# Patient Record
Sex: Female | Born: 1940 | Race: White | Hispanic: No
Health system: Southern US, Community
[De-identification: ages and names within clinical notes are randomized; demographics above are authoritative.]

## PROBLEM LIST (undated history)

## (undated) DIAGNOSIS — F0390 Unspecified dementia without behavioral disturbance: Secondary | ICD-10-CM

## (undated) DIAGNOSIS — I10 Essential (primary) hypertension: Secondary | ICD-10-CM

## (undated) DIAGNOSIS — E079 Disorder of thyroid, unspecified: Secondary | ICD-10-CM

## (undated) DIAGNOSIS — R1310 Dysphagia, unspecified: Secondary | ICD-10-CM

## (undated) DIAGNOSIS — J449 Chronic obstructive pulmonary disease, unspecified: Secondary | ICD-10-CM

## (undated) DIAGNOSIS — I509 Heart failure, unspecified: Secondary | ICD-10-CM

## (undated) HISTORY — DX: Unspecified dementia, unspecified severity, without behavioral disturbance, psychotic disturbance, mood disturbance, and anxiety: F03.90

## (undated) HISTORY — DX: Dysphagia, unspecified: R13.10

## (undated) HISTORY — DX: Disorder of thyroid, unspecified: E07.9

## (undated) HISTORY — DX: Essential (primary) hypertension: I10

## (undated) HISTORY — DX: Heart failure, unspecified: I50.9

## (undated) HISTORY — DX: Chronic obstructive pulmonary disease, unspecified: J44.9

---

## 2017-02-02 DIAGNOSIS — I509 Heart failure, unspecified: Secondary | ICD-10-CM

## 2017-02-02 DIAGNOSIS — E876 Hypokalemia: Secondary | ICD-10-CM

## 2017-02-02 DIAGNOSIS — R262 Difficulty in walking, not elsewhere classified: Secondary | ICD-10-CM

## 2017-02-02 DIAGNOSIS — J40 Bronchitis, not specified as acute or chronic: Secondary | ICD-10-CM

## 2017-02-03 DIAGNOSIS — E876 Hypokalemia: Secondary | ICD-10-CM | POA: Diagnosis not present

## 2017-02-03 DIAGNOSIS — J40 Bronchitis, not specified as acute or chronic: Secondary | ICD-10-CM | POA: Diagnosis not present

## 2017-02-03 DIAGNOSIS — R262 Difficulty in walking, not elsewhere classified: Secondary | ICD-10-CM | POA: Diagnosis not present

## 2017-02-03 DIAGNOSIS — I509 Heart failure, unspecified: Secondary | ICD-10-CM | POA: Diagnosis not present

## 2019-09-08 DIAGNOSIS — W19XXXA Unspecified fall, initial encounter: Secondary | ICD-10-CM

## 2019-09-08 DIAGNOSIS — N9489 Other specified conditions associated with female genital organs and menstrual cycle: Secondary | ICD-10-CM | POA: Diagnosis not present

## 2019-09-08 DIAGNOSIS — N179 Acute kidney failure, unspecified: Secondary | ICD-10-CM

## 2019-09-08 DIAGNOSIS — D72829 Elevated white blood cell count, unspecified: Secondary | ICD-10-CM

## 2019-09-08 DIAGNOSIS — I10 Essential (primary) hypertension: Secondary | ICD-10-CM

## 2019-09-09 DIAGNOSIS — N390 Urinary tract infection, site not specified: Secondary | ICD-10-CM

## 2019-09-09 DIAGNOSIS — E039 Hypothyroidism, unspecified: Secondary | ICD-10-CM

## 2019-09-09 DIAGNOSIS — F0390 Unspecified dementia without behavioral disturbance: Secondary | ICD-10-CM

## 2019-09-09 DIAGNOSIS — R262 Difficulty in walking, not elsewhere classified: Secondary | ICD-10-CM

## 2019-09-09 DIAGNOSIS — J449 Chronic obstructive pulmonary disease, unspecified: Secondary | ICD-10-CM | POA: Diagnosis not present

## 2019-09-10 DIAGNOSIS — N390 Urinary tract infection, site not specified: Secondary | ICD-10-CM | POA: Diagnosis not present

## 2019-09-10 DIAGNOSIS — E039 Hypothyroidism, unspecified: Secondary | ICD-10-CM | POA: Diagnosis not present

## 2019-09-10 DIAGNOSIS — F0390 Unspecified dementia without behavioral disturbance: Secondary | ICD-10-CM | POA: Diagnosis not present

## 2019-09-10 DIAGNOSIS — J449 Chronic obstructive pulmonary disease, unspecified: Secondary | ICD-10-CM | POA: Diagnosis not present

## 2019-09-11 DIAGNOSIS — E039 Hypothyroidism, unspecified: Secondary | ICD-10-CM | POA: Diagnosis not present

## 2019-09-11 DIAGNOSIS — N390 Urinary tract infection, site not specified: Secondary | ICD-10-CM | POA: Diagnosis not present

## 2019-09-11 DIAGNOSIS — J449 Chronic obstructive pulmonary disease, unspecified: Secondary | ICD-10-CM | POA: Diagnosis not present

## 2019-09-11 DIAGNOSIS — F0390 Unspecified dementia without behavioral disturbance: Secondary | ICD-10-CM | POA: Diagnosis not present

## 2020-08-24 ENCOUNTER — Emergency Department (HOSPITAL_COMMUNITY): Payer: Medicare Other

## 2020-08-24 ENCOUNTER — Other Ambulatory Visit: Payer: Self-pay

## 2020-08-24 ENCOUNTER — Emergency Department (HOSPITAL_COMMUNITY)
Admission: EM | Admit: 2020-08-24 | Discharge: 2020-08-24 | Disposition: A | Discharge status: Home / Self Care | Payer: Medicare Other | Attending: Emergency Medicine | Admitting: Emergency Medicine

## 2020-08-24 ENCOUNTER — Encounter (HOSPITAL_COMMUNITY): Payer: Self-pay | Admitting: Emergency Medicine

## 2020-08-24 DIAGNOSIS — J449 Chronic obstructive pulmonary disease, unspecified: Secondary | ICD-10-CM | POA: Diagnosis not present

## 2020-08-24 DIAGNOSIS — I6350 Cerebral infarction due to unspecified occlusion or stenosis of unspecified cerebral artery: Secondary | ICD-10-CM | POA: Diagnosis not present

## 2020-08-24 DIAGNOSIS — I509 Heart failure, unspecified: Secondary | ICD-10-CM | POA: Diagnosis not present

## 2020-08-24 DIAGNOSIS — I639 Cerebral infarction, unspecified: Secondary | ICD-10-CM | POA: Diagnosis not present

## 2020-08-24 DIAGNOSIS — F0390 Unspecified dementia without behavioral disturbance: Secondary | ICD-10-CM | POA: Diagnosis not present

## 2020-08-24 DIAGNOSIS — I110 Hypertensive heart disease with heart failure: Secondary | ICD-10-CM | POA: Diagnosis not present

## 2020-08-24 LAB — I-STAT CHEM 8, ED
BUN: 26 mg/dL — ABNORMAL HIGH (ref 8–23)
Calcium, Ion: 1.13 mmol/L — ABNORMAL LOW (ref 1.15–1.40)
Chloride: 96 mmol/L — ABNORMAL LOW (ref 98–111)
Creatinine, Ser: 1 mg/dL (ref 0.44–1.00)
Glucose, Bld: 101 mg/dL — ABNORMAL HIGH (ref 70–99)
HCT: 43 % (ref 36.0–46.0)
Hemoglobin: 14.6 g/dL (ref 12.0–15.0)
Potassium: 4.1 mmol/L (ref 3.5–5.1)
Sodium: 140 mmol/L (ref 135–145)
TCO2: 37 mmol/L — ABNORMAL HIGH (ref 22–32)

## 2020-08-24 LAB — DIFFERENTIAL
Abs Immature Granulocytes: 0.13 10*3/uL — ABNORMAL HIGH (ref 0.00–0.07)
Basophils Absolute: 0.1 10*3/uL (ref 0.0–0.1)
Basophils Relative: 1 %
Eosinophils Absolute: 0.4 10*3/uL (ref 0.0–0.5)
Eosinophils Relative: 5 %
Immature Granulocytes: 2 %
Lymphocytes Relative: 22 %
Lymphs Abs: 2 10*3/uL (ref 0.7–4.0)
Monocytes Absolute: 1 10*3/uL (ref 0.1–1.0)
Monocytes Relative: 12 %
Neutro Abs: 5.3 10*3/uL (ref 1.7–7.7)
Neutrophils Relative %: 58 %

## 2020-08-24 LAB — COMPREHENSIVE METABOLIC PANEL WITH GFR
ALT: 7 U/L (ref 0–44)
AST: 18 U/L (ref 15–41)
Albumin: 3 g/dL — ABNORMAL LOW (ref 3.5–5.0)
Alkaline Phosphatase: 41 U/L (ref 38–126)
Anion gap: 8 (ref 5–15)
BUN: 20 mg/dL (ref 8–23)
CO2: 34 mmol/L — ABNORMAL HIGH (ref 22–32)
Calcium: 8.9 mg/dL (ref 8.9–10.3)
Chloride: 97 mmol/L — ABNORMAL LOW (ref 98–111)
Creatinine, Ser: 1.02 mg/dL — ABNORMAL HIGH (ref 0.44–1.00)
GFR calc Af Amer: >60 mL/min
GFR calc non Af Amer: 52 mL/min — ABNORMAL LOW
Glucose, Bld: 100 mg/dL — ABNORMAL HIGH (ref 70–99)
Potassium: 4.2 mmol/L (ref 3.5–5.1)
Sodium: 139 mmol/L (ref 135–145)
Total Bilirubin: 0.8 mg/dL (ref 0.3–1.2)
Total Protein: 6.3 g/dL — ABNORMAL LOW (ref 6.5–8.1)

## 2020-08-24 LAB — APTT: aPTT: 26 s (ref 24–36)

## 2020-08-24 LAB — PROTIME-INR
INR: 1 (ref 0.8–1.2)
Prothrombin Time: 12.8 s (ref 11.4–15.2)

## 2020-08-24 LAB — CBC
HCT: 45.9 % (ref 36.0–46.0)
Hemoglobin: 13.6 g/dL (ref 12.0–15.0)
MCH: 29.8 pg (ref 26.0–34.0)
MCHC: 29.6 g/dL — ABNORMAL LOW (ref 30.0–36.0)
MCV: 100.4 fL — ABNORMAL HIGH (ref 80.0–100.0)
Platelets: 276 10*3/uL (ref 150–400)
RBC: 4.57 MIL/uL (ref 3.87–5.11)
RDW: 12 % (ref 11.5–15.5)
WBC: 8.9 10*3/uL (ref 4.0–10.5)
nRBC: 0 % (ref 0.0–0.2)

## 2020-08-24 MED ORDER — SODIUM CHLORIDE 0.9% FLUSH: 3.0000 mL | Freq: Once | INTRAVENOUS | Status: DC

## 2020-08-24 NOTE — ED Notes (Signed)
Pt incontinent of urine, pt cleaned, brief and linen replaced. Pt dressed in gown from home per request. Family at bedside.

## 2020-08-24 NOTE — ED Provider Notes (Signed)
[facility] Provider Note   CSN: [id number] Arrival date & time: 08/24/20  1413  An emergency department physician performed an initial assessment on this suspected stroke patient at 1413.  History Chief Complaint  Patient presents with  . Code Stroke    Hardie LoraRuth Huish is a 79 y.o. female.  79 year old female with past medical history below including dementia, CHF, COPD on 4 L oxygen, hypertension, thyroid disease who presents with altered mental status.  Patient was at her nursing facility reportedly eating lunch, last seen normal at 1:10 PM, when staff reported that she became unresponsive with left gaze deviation and right-sided weakness.  They report that she is normally A&Ox2, uses wheelchair but has symmetric strength of upper extremities and has fluent speech.  A code stroke was called in route.   LEVEL 5 CAVEAT DUE TO DEMENTIA  The history is provided by the EMS personnel and the nursing home.       Past Medical History:  Diagnosis Date  . CHF (congestive heart failure) (HCC)   . COPD (chronic obstructive pulmonary disease) (HCC)   . Dementia (HCC)   . Dysphagia   . Hypertension   . Thyroid disease     There are no problems to display for this patient.     OB History   No obstetric history on file.     No family history on file.  Social History   Tobacco Use  . Smoking status: Not on file  Substance Use Topics  . Alcohol use: Not on file  . Drug use: Not on file    Home Medications Prior to Admission medications   Not on File    Allergies    Iodinated diagnostic agents, Penicillins, and Prednisone  Review of Systems   Review of Systems  Unable to perform ROS: Dementia    Physical Exam Updated Vital Signs BP 131/71   Pulse (!) 57   Temp 98.6 F (37 C) (Oral)   Resp (!) 24   Wt 103.7 kg   SpO2 99%   Physical Exam Vitals and nursing note reviewed.  Constitutional:      General: She is not in  acute distress.    Appearance: She is well-developed.  HENT:     Head: Normocephalic and atraumatic.     Mouth/Throat:     Comments: Edentulous Eyes:     Extraocular Movements: Extraocular movements intact.     Conjunctiva/sclera: Conjunctivae normal.     Pupils: Pupils are equal, round, and reactive to light.  Cardiovascular:     Rate and Rhythm: Normal rate and regular rhythm.     Heart sounds: Normal heart sounds. No murmur heard.   Pulmonary:     Effort: Pulmonary effort is normal.     Breath sounds: Normal breath sounds.  Abdominal:     General: Bowel sounds are normal. There is no distension.     Palpations: Abdomen is soft.     Tenderness: There is no abdominal tenderness.  Musculoskeletal:        General: No tenderness.     Cervical back: Neck supple.  Skin:    General: Skin is warm and dry.  Neurological:     Mental Status: She is alert. She is disoriented.     Comments: Difficult to understand speech partially due to edentulous; No facial asymmetry, CN II-XII intact, 3/5 strength RUE w/ pronator drift, 5/5 strength LUE with no drift; 3/5 strength BLE with no clonus and no  hyperreflexia     ED Results / Procedures / Treatments   Labs (all labs ordered are listed, but only abnormal results are displayed) Labs Reviewed  CBC - Abnormal; Notable for the following components:      Result Value   MCV 100.4 (*)    MCHC 29.6 (*)    All other components within normal limits  DIFFERENTIAL - Abnormal; Notable for the following components:   Abs Immature Granulocytes 0.13 (*)    All other components within normal limits  COMPREHENSIVE METABOLIC PANEL - Abnormal; Notable for the following components:   Chloride 97 (*)    CO2 34 (*)    Glucose, Bld 100 (*)    Creatinine, Ser 1.02 (*)    Total Protein 6.3 (*)    Albumin 3.0 (*)    GFR calc non Af Amer 52 (*)    All other components within normal limits  I-STAT CHEM 8, ED - Abnormal; Notable for the following components:    Chloride 96 (*)    BUN 26 (*)    Glucose, Bld 101 (*)    Calcium, Ion 1.13 (*)    TCO2 37 (*)    All other components within normal limits  SARS CORONAVIRUS 2 BY RT PCR [facility] ORDER, PERFORMED IN [facility])  PROTIME-INR  APTT    EKG EKG Interpretation  Date/Time:  Monday August 24 2020 14:39:42 EDT Ventricular Rate:  58 PR Interval:    QRS Duration: 151 QT Interval:  468 QTC Calculation: 460 R Axis:   74 Text Interpretation: Sinus rhythm Short PR interval Right bundle branch block ST depr, consider ischemia, inferior leads Baseline wander in lead(s) II No previous ECGs available Confirmed by Frederick Peers [phone]) on 08/24/2020 4:11:24 PM   Radiology MR ANGIO HEAD WO CONTRAST  Result Date: 08/24/2020 CLINICAL DATA:  Neuro deficit, acute, stroke suspected. EXAM: MRI HEAD WITHOUT CONTRAST MRA HEAD WITHOUT CONTRAST TECHNIQUE: Multiplanar, multiecho pulse sequences of the brain and surrounding structures were obtained without intravenous contrast. Angiographic images of the head were obtained using MRA technique without contrast. COMPARISON:  Noncontrast head CT 08/24/2020. FINDINGS: MRI HEAD FINDINGS Brain: The patient was unable to tolerate the full examination. As a result, only axial and coronal diffusion-weighted sequences, a sagittal T1 weighted sequence, axial T2 weighted sequence and and axial T2/FLAIR sequence could be obtained. Some of the acquired sequences are significantly motion degraded. Most notably, there is mild/moderate motion degradation of the axial T2 weighted sequence and severe motion degradation of the axial T2/FLAIR sequence. Moderate generalized parenchymal atrophy. A subtle 10 mm focus of restricted diffusion is questioned within the right thalamocapsular junction which may reflect a small acute ischemic infarction (series 3, image 27). Additionally, there is patchy diffusion weighted hyperintensity within the bilateral centrum  semiovale/corona radiata which does not definitively demonstrate T2 shine through and may reflect acute watershed ischemia (for instance as seen on series 3, image 32). Background multifocal T2/FLAIR hyperintensity within the cerebral white matter is incompletely assessed due to the degree of motion degradation but consistent with chronic small vessel ischemic disease. No evidence of intracranial mass is identified. No extra-axial fluid collection is identified. No midline shift. Vascular: Reported below. Skull and upper cervical spine: No focal marrow lesion. Sinuses/Orbits: Visualized orbits show no acute finding. Mild ethmoid sinus mucosal thickening. Trace fluid within left mastoid air cells. MRA HEAD FINDINGS The examination is moderate to severely motion degraded, significantly limiting evaluation for stenoses and for small aneurysms. The intracranial  internal carotid arteries are patent. The M1 middle cerebral arteries are patent. Apparent high-grade stenosis within the distal M1 right middle cerebral artery. Apparent high-grade stenoses within multiple proximal M2 middle cerebral artery branches bilaterally. However, some of these stenoses could potentially be accentuated by motion artifact. The left anterior cerebral artery is poorly visualized beyond the left A2 segment. Hypoplastic right A1 segment. Motion degradation significantly limits evaluation of the intracranial vertebral arteries. However, the intracranial vertebral arteries appear patent. The intracranial right vertebral artery terminates predominantly as the right PICA. The basilar artery is diminutive and irregular in appearance, but appears patent. Predominantly fetal origin bilateral posterior cerebral arteries. The posterior cerebral arteries are patent proximally. Apparent severe stenoses within the P2 posterior cerebral arteries bilaterally which could potentially be accentuated by motion artifact. No intracranial aneurysm is identified.  These results were discussed in person at the time of interpretation on 08/24/2020 at 4:00 pm to provider [facility] , who verbally acknowledged these results. IMPRESSION: MRI brain: 1. Significantly motion degraded and prematurely terminated examination as described. 2. A subtle 10 mm acute ischemic infarct is questioned within the right thalamocapsular junction. 3. Additionally, patchy acute watershed infarcts are questioned within the bilateral centrum semiovale/corona radiata described. 4. Background generalized parenchymal atrophy and chronic small vessel ischemic disease. MRA head: 1. Moderate-to-severely motion degraded examination which significantly limits evaluation. 2. The left anterior cerebral artery is poorly delineatedbeyond the A2 segment. This may be secondary to motion artifact or vessel occlusion. 3. Apparent high-grade stenosis within the distal M1 right middle cerebral artery. Additionally, there are apparent high-grade stenoses within multiple proximal M2 middle cerebral artery branches bilaterally. Some of these stenoses could be accentuated by motion artifact. 4. Predominantly fetal origin posterior cerebral arteries. Apparent severe stenoses within the P2 posterior cerebral arteries bilaterally, which could potentially be accentuated by motion artifact. Electronically Signed   By: Jackey Loge DO   On: 08/24/2020 16:53   MR BRAIN WO CONTRAST  Result Date: 08/24/2020 CLINICAL DATA:  Neuro deficit, acute, stroke suspected. EXAM: MRI HEAD WITHOUT CONTRAST MRA HEAD WITHOUT CONTRAST TECHNIQUE: Multiplanar, multiecho pulse sequences of the brain and surrounding structures were obtained without intravenous contrast. Angiographic images of the head were obtained using MRA technique without contrast. COMPARISON:  Noncontrast head CT 08/24/2020. FINDINGS: MRI HEAD FINDINGS Brain: The patient was unable to tolerate the full examination. As a result, only axial and coronal diffusion-weighted  sequences, a sagittal T1 weighted sequence, axial T2 weighted sequence and and axial T2/FLAIR sequence could be obtained. Some of the acquired sequences are significantly motion degraded. Most notably, there is mild/moderate motion degradation of the axial T2 weighted sequence and severe motion degradation of the axial T2/FLAIR sequence. Moderate generalized parenchymal atrophy. A subtle 10 mm focus of restricted diffusion is questioned within the right thalamocapsular junction which may reflect a small acute ischemic infarction (series 3, image 27). Additionally, there is patchy diffusion weighted hyperintensity within the bilateral centrum semiovale/corona radiata which does not definitively demonstrate T2 shine through and may reflect acute watershed ischemia (for instance as seen on series 3, image 32). Background multifocal T2/FLAIR hyperintensity within the cerebral white matter is incompletely assessed due to the degree of motion degradation but consistent with chronic small vessel ischemic disease. No evidence of intracranial mass is identified. No extra-axial fluid collection is identified. No midline shift. Vascular: Reported below. Skull and upper cervical spine: No focal marrow lesion. Sinuses/Orbits: Visualized orbits show no acute finding. Mild ethmoid sinus mucosal thickening. Trace fluid  within left mastoid air cells. MRA HEAD FINDINGS The examination is moderate to severely motion degraded, significantly limiting evaluation for stenoses and for small aneurysms. The intracranial internal carotid arteries are patent. The M1 middle cerebral arteries are patent. Apparent high-grade stenosis within the distal M1 right middle cerebral artery. Apparent high-grade stenoses within multiple proximal M2 middle cerebral artery branches bilaterally. However, some of these stenoses could potentially be accentuated by motion artifact. The left anterior cerebral artery is poorly visualized beyond the left A2  segment. Hypoplastic right A1 segment. Motion degradation significantly limits evaluation of the intracranial vertebral arteries. However, the intracranial vertebral arteries appear patent. The intracranial right vertebral artery terminates predominantly as the right PICA. The basilar artery is diminutive and irregular in appearance, but appears patent. Predominantly fetal origin bilateral posterior cerebral arteries. The posterior cerebral arteries are patent proximally. Apparent severe stenoses within the P2 posterior cerebral arteries bilaterally which could potentially be accentuated by motion artifact. No intracranial aneurysm is identified. These results were discussed in person at the time of interpretation on 08/24/2020 at 4:00 pm to provider [facility] , who verbally acknowledged these results. IMPRESSION: MRI brain: 1. Significantly motion degraded and prematurely terminated examination as described. 2. A subtle 10 mm acute ischemic infarct is questioned within the right thalamocapsular junction. 3. Additionally, patchy acute watershed infarcts are questioned within the bilateral centrum semiovale/corona radiata described. 4. Background generalized parenchymal atrophy and chronic small vessel ischemic disease. MRA head: 1. Moderate-to-severely motion degraded examination which significantly limits evaluation. 2. The left anterior cerebral artery is poorly delineatedbeyond the A2 segment. This may be secondary to motion artifact or vessel occlusion. 3. Apparent high-grade stenosis within the distal M1 right middle cerebral artery. Additionally, there are apparent high-grade stenoses within multiple proximal M2 middle cerebral artery branches bilaterally. Some of these stenoses could be accentuated by motion artifact. 4. Predominantly fetal origin posterior cerebral arteries. Apparent severe stenoses within the P2 posterior cerebral arteries bilaterally, which could potentially be accentuated by motion  artifact. Electronically Signed   By: Jackey Loge DO   On: 08/24/2020 16:53   DG Chest Portable 1 View  Result Date: 08/24/2020 CLINICAL DATA:  Altered mental status. EXAM: PORTABLE CHEST 1 VIEW COMPARISON:  July 08, 2017 FINDINGS: Mild, diffuse, chronic appearing increased lung markings are seen without evidence of acute infiltrate, pleural effusion or pneumothorax. The cardiac silhouette is markedly enlarged. There is marked severity calcification of the thoracic aorta. Multilevel degenerative changes seen throughout the thoracic spine with mild to moderate severity dextroscoliosis. IMPRESSION: Chronic appearing increased lung markings without evidence of acute or active cardiopulmonary disease. Electronically Signed   By: Aram Candela M.D.   On: 08/24/2020 15:15   CT HEAD CODE STROKE WO CONTRAST  Result Date: 08/24/2020 CLINICAL DATA:  Code stroke. Acute neuro deficit. Aphasia. Left-sided weakness. EXAM: CT HEAD WITHOUT CONTRAST TECHNIQUE: Contiguous axial images were obtained from the base of the skull through the vertex without intravenous contrast. COMPARISON:  CT head 11/03/2017 FINDINGS: Brain: Mild atrophy. Negative for acute infarct, hemorrhage, mass. Negative for hydrocephalus. Vascular: Negative for hyperdense vessel Skull: Negative Sinuses/Orbits: Paranasal sinuses clear.  Negative orbit. Other: None ASPECTS ([place] Stroke Program Early CT Score) - Ganglionic level infarction (caudate, lentiform nuclei, internal capsule, insula, M1-M3 cortex): 7 - Supraganglionic infarction (M4-M6 cortex): 3 Total score (0-10 with 10 being normal): 10 IMPRESSION: 1. No acute abnormality.  Generalized atrophy 2. ASPECTS is 10 3. Code stroke imaging results were communicated on 08/24/2020 at 2:32 pm  to provider Bhagat via [employer] Electronically Signed   By: Marlan Palau M.D.   On: 08/24/2020 14:32    Procedures Procedures (including critical care time) CRITICAL CARE Performed by: Ambrose Finland  Jackolyn Geron   Total critical care time: 30 minutes  Critical care time was exclusive of separately billable procedures and treating other patients.  Critical care was necessary to treat or prevent imminent or life-threatening deterioration.  Critical care was time spent personally by me on the following activities: development of treatment plan with patient and/or surrogate as well as nursing, discussions with consultants, evaluation of patient's response to treatment, examination of patient, obtaining history from patient or surrogate, ordering and performing treatments and interventions, ordering and review of laboratory studies, ordering and review of radiographic studies, pulse oximetry and re-evaluation of patient's condition.  Medications Ordered in ED Medications  sodium chloride flush (NS) 0.9 % injection 3 mL (has no administration in time range)    ED Course  I have reviewed the triage vital signs and the nursing notes.  Pertinent labs & imaging results that were available during my care of the patient were reviewed by me and considered in my medical decision making (see chart for details).    MDM Rules/Calculators/A&P                          Patient arrived as code stroke and was immediately taken to CT scan where she was evaluated by stroke team, Dr. Iver Nestle. TPA considered but not given. Rapidly improved in the ED, still with residual R sided arm weakness on my exam but speech clearer and able to name objects better than initial exam. No gaze deviation. Head CT negative acute, CXR negative acute, labs overall unremarkable.   MRI reviewed by Dr. Iver Nestle does show small right thalmocapsular stroke and b/l watershed areas. She has spoken with family; they do not want to pursue work up on this and would like pt to be discharged w/ palliative care f/u. Pt awake and alert at time of d/c.   Called her nursing facility, [facility], and confirmed that they are able to do  palliative care/comfort care at the facility.   Final Clinical Impression(s) / ED Diagnoses Final diagnoses:  Cerebrovascular accident (CVA), unspecified mechanism [facility])    Rx / DC Orders ED Discharge Orders    None       Yomar Mejorado, Ambrose Finland, MD 08/24/20 1718

## 2020-08-24 NOTE — Code Documentation (Signed)
Stroke Response Nurse Documentation Code Documentation  Alba Perillo is a 79 y.o. female arriving to [facility] ED via [facility] on 9/20 with past medical hx of dementia, CHF, COPD chronically wearing 4L of oxygen. Recently treated for Pneumonia per the facility and has been doing better. Code stroke was activated by [facility]. Patient from facility [facility] where she was LKW at 1310 by the CNA. CNA left the patient with her lunch and when she returned she noted that the patient was gazing to the left and noted to have right sided weakness. On clopidogrel 75 mg daily. Stroke team at the bedside on patient arrival. Labs drawn and patient cleared for CT by EDP. Patient to CT with team. NIHSS 11, see documentation for details and code stroke times. Patient with disoriented, right facial droop, right arm weakness, bilateral leg weakness, Global aphasia  and dysarthria  on exam. The following imaging was completed:  CT Head. Patient is not a candidate for tPA due to stroke not suspected by MD Bhagat.  On reassessment with MD at 1445, Pt noted to have right sided weakness > left with some very mild potential facial droop. Facility called and confirmed above story. Decision made to take patient to MRI to confirm decision. Care/Plan: MRI to be completed. Q2 hour VS and mNIHSS to be completed. Bedside handoff with ED RN CRystal.    Lucila Maine  Stroke Response RN

## 2020-08-24 NOTE — Consult Note (Addendum)
Neurology Consultation Reason for Consult: L gaze preference  Referring Physician: Frederick Peers, MD   CC: None  History is obtained from: Patient, EMS, Nursing facility and chart review; number on file for family "cannot be completed as dialed" -- accurate number found in outside records and updated in our file Molly Keith [id number])  HPI: Molly Keith is a 79 y.o. female with a past medical history significant for hypertension, hyperlipidemia, coronary artery disease, hypothyroidism, diabetes, history of stroke, obesity, anxiety/depression, dementia   She additionally had Covid in October 2020  Per chart review she had a left wrist fracture in September 2020, as well as a fall and tear of the abductor aponeurosis in 09/12/2019, which was complicated by a large pelvic hematoma  Patient is intermittently oriented, at times reporting she is in her 30s but then reporting that she will turn 79 on November 5.  She reports she has been nonambulatory since she "fractured her pelvis" which daughter confirms is the aforementioned pelvic hematoma.  Initially there was significant conflicting history provided.  Code stroke page reported left-sided weakness.  EMS reported that the patient was found unresponsive by nursing home staff and was noted to have a left-sided gaze on their evaluation with bilateral upper extremity weakness.  Nursing home staff reported that the patient was found to be on the floor with left-sided gaze and new onset acute right arm weakness.  Family reported that the patient had right arm weakness for the past 2 weeks or so.  On EMS evaluation as well as during my evaluation the patient's mental status was steadily improving, with more and more sensible answers to words although her speech was quite slurred.  She intermittently had a left gaze preference.  LKW: 1:10 PM  tPA given?: No, symptoms minor, rapidly improving and family does not feel tPA is within goals of care IA performed?:  No, due to premorbid MRS   Premorbid modified rankin scale:     4 - Moderately severe disability.   ROS: Unable to obtain fully due to baseline mental status.  However daughter reports that the patient recently had pneumonia and fluid around her heart per the nursing home facility.  Past Medical History:  Diagnosis Date  . CHF (congestive heart failure) (HCC)   . COPD (chronic obstructive pulmonary disease) (HCC)   . Dementia (HCC)   . Dysphagia   . Hypertension   . Thyroid disease    No family history on file. -Not relevant given goals of care  Social History:  has no history on file for tobacco use, alcohol use, and drug use.   Exam: Current vital signs: BP (!) 133/41   Pulse (!) 59   Temp 98.6 F (37 C) (Oral)   Resp (!) 22   Wt 103.7 kg   SpO2 99%  Vital signs in last 24 hours: Temp:  [98.6 F (37 C)] 98.6 F (37 C) (09/20 1435) Pulse Rate:  [58-59] 59 (09/20 1445) Resp:  [21-22] 22 (09/20 1445) BP: (133-150)/(41-55) 133/41 (09/20 1445) SpO2:  [97 %-99 %] 99 % (09/20 1445) Weight:  [103.7 kg] 103.7 kg (09/20 1445)   Physical Exam  Constitutional: Appears chronically ill   Psych: Affect appropriate to situation Eyes: No scleral injection HENT: No OP obstruction, poor dentition with many missing teeth MSK: Externally rotated at the hips bilaterally, with feet held in plantar flexion and hammertoe deformities Cardiovascular: Normal rate and regular rhythm, intermittently sinus brady  Respiratory: Effort normal, non-labored breathing, mild audible wheeze GI:  Soft.  No distension. There is no tenderness.  Skin: WDI  Neuro: Mental Status: Patient is awake, alert, oriented to person, place, intermittently year,  Patient is unable to give a clear and coherent history. Possible mild neglect of the right side  Cranial Nerves: II: Visual Fields are full to blink to threat. Pupils are equal, round, and reactive to light.  III,IV, VI: EOMI without ptosis or  diploplia, intermittently mild left gaze preference but fully burys to the right  V: Facial sensation is symmetric to touch VII: Facial movement is symmetric.  VIII: hearing is intact to voice X: Uvula difficult to visual  XI: Head turn is grossly symmetric. XII: tongue is midline without atrophy or fasciculations.  Motor: Tone is normal. Bulk is normal.  Sensory: Equally responsive to stimuli in all four extremities  Deep Tendon Reflexes: No clonus at the ankles, pain limited lower extremity reflexes   Plantars: Toes are downgoing bilaterally.  Cerebellar: FNF intact in the bilateral upper extremities, limited on right due to weakness.    I have reviewed labs in epic and the results pertinent to this consultation are: Creatinine 1.2, white blood cell count 8.9,  I have reviewed the images obtained:  Head CT without acute intracranial process MRI brain with diffusion restriction suggestive of a right thalamic capsular stroke as well as possible bilateral right greater than left watershed strokes in the MCA watershed territory MRA brain severely motion limited but no occlusion of the basilar artery on repeat MRA -Please note CTA could not be obtained secondary to contrast allergy  Impression: This is a 79 year old woman presenting with acute onset dysarthria and left-sided gaze deviation, found to have likely strokes on MRI brain.  In the setting of her baseline modified Rankin scale, she is not a candidate for intra-arterial thrombectomy.  Given the small size of her stroke and the rapid improvement of her symptoms (with family endorsing some residual dysarthria and some residual difficulty with gaze but baseline cognitive capability and language), the risks of tPA were felt to outweigh the benefits.  Additionally, inpatient work-up was offered to the patient/family, but they reported that she would prefer to focus on comfort at this point.  Her daughter Molly Keith reported she did want to  discuss this a bit further with her sisters before committing to a CODE STATUS change  Recommendations: - MRI brain, MRA head w/o and neck w and w/o ordered; studies completed as above, note contrast was not given as patient did not tolerate further scanning after MRA.  - Family and patient reports they would prefer to have patient return for facility and transition to comfort/palliative measures only for goals of care; appreciate ED provider assistance with confirming CODE STATUS change with the remainder of the family  Brooke Dare MD-PhD [facility] [phone]

## 2020-08-24 NOTE — ED Notes (Signed)
Pt returned to room 11 from MRI. Neurologist MD at bedside conversing with pt and pt's daughter.

## 2020-08-24 NOTE — Discharge Instructions (Addendum)
PLEASE FOLLOW UP WITH YOUR PRIMARY CARE PROVIDER AT YOUR NURSING FACILITY TO PURSUE PALLIATIVE CARE/COMFORT CARE MEASURES.

## 2020-08-24 NOTE — ED Triage Notes (Signed)
Arrived via EMS CODE STROKE. 1300 was sitting down eating lunch. LKW 1310. EMS reported staff said patient unresponsive. EMS reported patient left side gaze, dysphagia, bilateral upper extremity flaccid. En route able to look right and answer yes and no questions.

## 2020-08-24 NOTE — ED Notes (Signed)
Report called to [facility]. Awaiting transport back to facility.

## 2020-08-24 NOTE — ED Notes (Signed)
[facility] has been called a little while ago, said she was ways down on the list but did not give me a #

## 2020-08-24 NOTE — ED Notes (Signed)
Pt move to the hallway per CN request, pt just waiting for [facility] transportation to go back home, family member arguing on hallway laud and disrespectful to nursing staff for moving her to hallway, family and pt oriented that another critical pt is in need to use the room for treatment, family still arguing with nursing staff and continues to be laud on the hallway about the staff didn't clean a room prior to bring another pt to the room. Room was wipe off for next pt by nursing staff. Pt and family oriented by all nursing staff that we are just waiting for [facility] transportation to take her home and they will arrive ASAP.

## 2020-10-24 ENCOUNTER — Encounter (HOSPITAL_COMMUNITY): Payer: Self-pay

## 2020-10-24 ENCOUNTER — Emergency Department (HOSPITAL_COMMUNITY)
Admission: EM | Admit: 2020-10-24 | Discharge: 2020-10-24 | Disposition: A | Discharge status: Home / Self Care | Payer: Medicare Other | Attending: Emergency Medicine | Admitting: Emergency Medicine

## 2020-10-24 ENCOUNTER — Other Ambulatory Visit: Payer: Self-pay

## 2020-10-24 ENCOUNTER — Emergency Department (HOSPITAL_COMMUNITY): Payer: Medicare Other

## 2020-10-24 DIAGNOSIS — I110 Hypertensive heart disease with heart failure: Secondary | ICD-10-CM | POA: Diagnosis not present

## 2020-10-24 DIAGNOSIS — R4182 Altered mental status, unspecified: Secondary | ICD-10-CM | POA: Diagnosis not present

## 2020-10-24 DIAGNOSIS — I509 Heart failure, unspecified: Secondary | ICD-10-CM | POA: Diagnosis not present

## 2020-10-24 DIAGNOSIS — N179 Acute kidney failure, unspecified: Secondary | ICD-10-CM

## 2020-10-24 DIAGNOSIS — I639 Cerebral infarction, unspecified: Secondary | ICD-10-CM | POA: Insufficient documentation

## 2020-10-24 DIAGNOSIS — F0390 Unspecified dementia without behavioral disturbance: Secondary | ICD-10-CM | POA: Diagnosis not present

## 2020-10-24 DIAGNOSIS — R4781 Slurred speech: Secondary | ICD-10-CM | POA: Diagnosis present

## 2020-10-24 DIAGNOSIS — Z20822 Contact with and (suspected) exposure to covid-19: Secondary | ICD-10-CM | POA: Diagnosis not present

## 2020-10-24 DIAGNOSIS — N3000 Acute cystitis without hematuria: Secondary | ICD-10-CM

## 2020-10-24 DIAGNOSIS — J449 Chronic obstructive pulmonary disease, unspecified: Secondary | ICD-10-CM | POA: Insufficient documentation

## 2020-10-24 LAB — COMPREHENSIVE METABOLIC PANEL
ALT: 11 U/L (ref 0–44)
AST: 15 U/L (ref 15–41)
Albumin: 2.7 g/dL — ABNORMAL LOW (ref 3.5–5.0)
Alkaline Phosphatase: 38 U/L (ref 38–126)
Anion gap: 16 — ABNORMAL HIGH (ref 5–15)
BUN: 52 mg/dL — ABNORMAL HIGH (ref 8–23)
CO2: 19 mmol/L — ABNORMAL LOW (ref 22–32)
Calcium: 8.2 mg/dL — ABNORMAL LOW (ref 8.9–10.3)
Chloride: 105 mmol/L (ref 98–111)
Creatinine, Ser: 2.33 mg/dL — ABNORMAL HIGH (ref 0.44–1.00)
GFR, Estimated: 21 mL/min — ABNORMAL LOW (ref >60)
Glucose, Bld: 163 mg/dL — ABNORMAL HIGH (ref 70–99)
Potassium: 3.3 mmol/L — ABNORMAL LOW (ref 3.5–5.1)
Sodium: 140 mmol/L (ref 135–145)
Total Bilirubin: 1.2 mg/dL (ref 0.3–1.2)
Total Protein: 6.3 g/dL — ABNORMAL LOW (ref 6.5–8.1)

## 2020-10-24 LAB — URINALYSIS, ROUTINE W REFLEX MICROSCOPIC
Bilirubin Urine: NEGATIVE
Glucose, UA: NEGATIVE mg/dL
Hgb urine dipstick: NEGATIVE
Ketones, ur: NEGATIVE mg/dL
Nitrite: NEGATIVE
Protein, ur: NEGATIVE mg/dL
Specific Gravity, Urine: 1.025 (ref 1.005–1.030)
WBC, UA: >50 WBC/hpf — ABNORMAL HIGH (ref 0–5)
pH: 5 (ref 5.0–8.0)

## 2020-10-24 LAB — I-STAT VENOUS BLOOD GAS, ED
Acid-base deficit: 3 mmol/L — ABNORMAL HIGH (ref 0.0–2.0)
Bicarbonate: 21.4 mmol/L (ref 20.0–28.0)
Calcium, Ion: 0.99 mmol/L — ABNORMAL LOW (ref 1.15–1.40)
HCT: 43 % (ref 36.0–46.0)
Hemoglobin: 14.6 g/dL (ref 12.0–15.0)
O2 Saturation: 77 %
Potassium: 3.3 mmol/L — ABNORMAL LOW (ref 3.5–5.1)
Sodium: 141 mmol/L (ref 135–145)
TCO2: 23 mmol/L (ref 22–32)
pCO2, Ven: 37.2 mmHg — ABNORMAL LOW (ref 44.0–60.0)
pH, Ven: 7.369 (ref 7.250–7.430)
pO2, Ven: 42 mmHg (ref 32.0–45.0)

## 2020-10-24 LAB — CBC
HCT: 43.2 % (ref 36.0–46.0)
Hemoglobin: 13.7 g/dL (ref 12.0–15.0)
MCH: 30 pg (ref 26.0–34.0)
MCHC: 31.7 g/dL (ref 30.0–36.0)
MCV: 94.7 fL (ref 80.0–100.0)
Platelets: UNDETERMINED 10*3/uL (ref 150–400)
RBC: 4.56 MIL/uL (ref 3.87–5.11)
RDW: 13.2 % (ref 11.5–15.5)
WBC: 39.3 10*3/uL — ABNORMAL HIGH (ref 4.0–10.5)
nRBC: 0 % (ref 0.0–0.2)

## 2020-10-24 LAB — LACTIC ACID, PLASMA: Lactic Acid, Venous: 3.7 mmol/L (ref 0.5–1.9)

## 2020-10-24 LAB — DIFFERENTIAL
Abs Immature Granulocytes: 0 10*3/uL (ref 0.00–0.07)
Band Neutrophils: 17 %
Basophils Absolute: 0 10*3/uL (ref 0.0–0.1)
Basophils Relative: 0 %
Eosinophils Absolute: 0 10*3/uL (ref 0.0–0.5)
Eosinophils Relative: 0 %
Lymphocytes Relative: 2 %
Lymphs Abs: 0.8 10*3/uL (ref 0.7–4.0)
Monocytes Absolute: 1.6 10*3/uL — ABNORMAL HIGH (ref 0.1–1.0)
Monocytes Relative: 4 %
Neutro Abs: 36.9 10*3/uL — ABNORMAL HIGH (ref 1.7–7.7)
Neutrophils Relative %: 77 %

## 2020-10-24 LAB — APTT: aPTT: 31 seconds (ref 24–36)

## 2020-10-24 LAB — RESPIRATORY PANEL BY RT PCR (FLU A&B, COVID)
Influenza A by PCR: NEGATIVE
Influenza B by PCR: NEGATIVE
SARS Coronavirus 2 by RT PCR: NEGATIVE

## 2020-10-24 LAB — ETHANOL: Alcohol, Ethyl (B): <10 mg/dL (ref <10)

## 2020-10-24 LAB — RAPID URINE DRUG SCREEN, HOSP PERFORMED
Amphetamines: NOT DETECTED
Barbiturates: NOT DETECTED
Benzodiazepines: NOT DETECTED
Cocaine: NOT DETECTED
Opiates: POSITIVE — AB
Tetrahydrocannabinol: NOT DETECTED

## 2020-10-24 LAB — PROTIME-INR
INR: 1.4 — ABNORMAL HIGH (ref 0.8–1.2)
Prothrombin Time: 17 seconds — ABNORMAL HIGH (ref 11.4–15.2)

## 2020-10-24 MED ORDER — CEFIXIME 400 MG PO CAPS: 400.0000 mg | ORAL_CAPSULE | Freq: Every day | ORAL | 0 refills | Status: AC

## 2020-10-24 MED ORDER — SODIUM CHLORIDE 0.9 % IV SOLN
1.0000 g | Freq: Once | INTRAVENOUS | Status: AC
  Administered 2020-10-24: 1 g via INTRAVENOUS
  Filled 2020-10-24: qty 10

## 2020-10-24 NOTE — ED Notes (Signed)
ED Provider at bedside, giving update to family at bedside.

## 2020-10-24 NOTE — ED Triage Notes (Addendum)
Pt BIB [facility] from [facility] d/t stroke-like s/s that had her speech more slurred & altered than normal. She does still have Rt sided deficit from an old stroke, all VSS. "LKN greater than 24 hrs ago" (per EMS).

## 2020-10-24 NOTE — ED Notes (Signed)
[facility] already called by [place].

## 2020-10-24 NOTE — ED Notes (Signed)
Daughter at bedside, refused to have foley cath placed on pt.

## 2020-10-24 NOTE — Discharge Instructions (Signed)
The CT scan did show a stroke.  The labs tests show a urinary tract infection, elevated WBC count and kidney failure.  If you change your mind, feel free to return to the hospital at any time.  I have written a prescription for an antibiotic.

## 2020-10-24 NOTE — ED Notes (Signed)
Patient denies pain and is resting comfortably.  

## 2020-10-24 NOTE — ED Notes (Signed)
Urine sent to lab WITH culture. 

## 2020-10-24 NOTE — ED Notes (Signed)
[facility] arrived at bedside.report given to staff. Discharge papers and DNR form with [facility].

## 2020-10-24 NOTE — ED Provider Notes (Signed)
[facility] EMERGENCY DEPARTMENT Provider Note   CSN: [id number] Arrival date & time: 10/24/20  1607    Level 5 caveat, confusion, speech difficulty History Chief Complaint  Patient presents with  . Stroke-like s/s    Molly Keith is a 79 y.o. female.  HPI   Pt is a resident of a nursing facility.   She was sent to the ED because staff indicated that her speech was more slurred and pt was more confused.   Last time known well was more than 24 hours ago.  Pt has history of prior stroke.  Pt is difficult to understand to gather more information but she does answer questions/  Past Medical History:  Diagnosis Date  . CHF (congestive heart failure) (HCC)   . COPD (chronic obstructive pulmonary disease) (HCC)   . Dementia (HCC)   . Dysphagia   . Hypertension   . Thyroid disease     There are no problems to display for this patient.   History reviewed. No pertinent surgical history.   OB History   No obstetric history on file.     History reviewed. No pertinent family history.  Social History   Tobacco Use  . Smoking status: Not on file  Substance Use Topics  . Alcohol use: Not on file  . Drug use: Not on file    Home Medications Prior to Admission medications   Medication Sig Start Date End Date Taking? Authorizing Provider  cefixime (SUPRAX) 400 MG CAPS capsule Take 1 capsule (400 mg total) by mouth daily. 10/24/20   Linwood Dibbles, MD    Allergies    Iodinated diagnostic agents, Penicillins, and Prednisone  Review of Systems   Review of Systems  Unable to perform ROS: Mental status change    Physical Exam Updated Vital Signs BP (!) 114/32   Pulse 76   Temp 97.8 F (36.6 C) (Oral)   Resp (!) 7   SpO2 100%   Physical Exam Vitals and nursing note reviewed.  Constitutional:      General: She is not in acute distress.    Appearance: She is well-developed. She is not diaphoretic.  HENT:     Head: Normocephalic and atraumatic.      Right Ear: External ear normal.     Left Ear: External ear normal.  Eyes:     General: No scleral icterus.       Right eye: No discharge.        Left eye: No discharge.     Conjunctiva/sclera: Conjunctivae normal.  Neck:     Trachea: No tracheal deviation.  Cardiovascular:     Rate and Rhythm: Normal rate and regular rhythm.  Pulmonary:     Effort: Pulmonary effort is normal. No respiratory distress.     Breath sounds: Normal breath sounds. No stridor. No wheezing or rales.  Abdominal:     General: Bowel sounds are normal. There is no distension.     Palpations: Abdomen is soft.     Tenderness: There is no abdominal tenderness. There is no guarding or rebound.  Musculoskeletal:        General: No tenderness.     Cervical back: Neck supple.  Skin:    General: Skin is warm and dry.     Findings: No rash.  Neurological:     Mental Status: She is alert and oriented to person, place, and time.     Cranial Nerves: No cranial nerve deficit (right facial droop, extraocular movements  intact, slurred speech ).     Sensory: No sensory deficit.     Motor: No abnormal muscle tone or seizure activity.     Coordination: Coordination normal.     Comments: Will hold left arm off the bed, unable to hold right, will not hold either leg off bed , sensation intact in all extremities, no visual field cuts, no left or right sided neglect,, no nystagmus noted      ED Results / Procedures / Treatments   Labs (all labs ordered are listed, but only abnormal results are displayed) Labs Reviewed  PROTIME-INR - Abnormal; Notable for the following components:      Result Value   Prothrombin Time 17.0 (*)    INR 1.4 (*)    All other components within normal limits  CBC - Abnormal; Notable for the following components:   WBC 39.3 (*)    All other components within normal limits  DIFFERENTIAL - Abnormal; Notable for the following components:   Neutro Abs 36.9 (*)    Monocytes Absolute 1.6 (*)    All  other components within normal limits  COMPREHENSIVE METABOLIC PANEL - Abnormal; Notable for the following components:   Potassium 3.3 (*)    CO2 19 (*)    Glucose, Bld 163 (*)    BUN 52 (*)    Creatinine, Ser 2.33 (*)    Calcium 8.2 (*)    Total Protein 6.3 (*)    Albumin 2.7 (*)    GFR, Estimated 21 (*)    Anion gap 16 (*)    All other components within normal limits  RAPID URINE DRUG SCREEN, HOSP PERFORMED - Abnormal; Notable for the following components:   Opiates POSITIVE (*)    All other components within normal limits  URINALYSIS, ROUTINE W REFLEX MICROSCOPIC - Abnormal; Notable for the following components:   APPearance HAZY (*)    Leukocytes,Ua LARGE (*)    WBC, UA >50 (*)    Bacteria, UA RARE (*)    All other components within normal limits  LACTIC ACID, PLASMA - Abnormal; Notable for the following components:   Lactic Acid, Venous 3.7 (*)    All other components within normal limits  I-STAT VENOUS BLOOD GAS, ED - Abnormal; Notable for the following components:   pCO2, Ven 37.2 (*)    Acid-base deficit 3.0 (*)    Potassium 3.3 (*)    Calcium, Ion 0.99 (*)    All other components within normal limits  RESPIRATORY PANEL BY RT PCR (FLU A&B, COVID)  CULTURE, BLOOD (ROUTINE X 2)  CULTURE, BLOOD (ROUTINE X 2)  ETHANOL  APTT  DIFFERENTIAL    EKG EKG Interpretation  Date/Time:  Saturday October 24 2020 16:19:38 EST Ventricular Rate:  74 PR Interval:    QRS Duration: 159 QT Interval:  471 QTC Calculation: 523 R Axis:   69 Text Interpretation: Sinus rhythm Borderline short PR interval Right bundle branch block ST depr, consider ischemia, inferior leads No significant change since last tracing Confirmed by Linwood Dibbles [phone]) on 10/24/2020 4:22:32 PM   Radiology CT HEAD WO CONTRAST  Result Date: 10/24/2020 CLINICAL DATA:  Altered mental status EXAM: CT HEAD WITHOUT CONTRAST TECHNIQUE: Contiguous axial images were obtained from the base of the skull through the  vertex without intravenous contrast. COMPARISON:  MRI August 24, 2020 FINDINGS: Brain: Area of hypodensity involving the left corona radiata. No extra-axial collections. There is dilatation the ventricles and sulci consistent with age-related atrophy. Low-attenuation changes in the  deep white matter consistent with small vessel ischemia. Vascular: No hyperdense vessel or unexpected calcification. Skull: The skull is intact. No fracture or focal lesion identified. Sinuses/Orbits: The visualized paranasal sinuses and mastoid air cells are clear. The orbits and globes intact. Other: None IMPRESSION: Subacute/acute infarct involving the left corona radiata. Findings consistent with age related atrophy and chronic small vessel ischemia Electronically Signed   By: Jonna ClarkBindu  Avutu M.D.   On: 10/24/2020 19:18   DG Chest Portable 1 View  Result Date: 10/24/2020 CLINICAL DATA:  Confusion EXAM: PORTABLE CHEST 1 VIEW COMPARISON:  08/24/2020 FINDINGS: Stable cardiomegaly. Atherosclerotic calcification of the aortic knob. Chronically prominent interstitial markings throughout both lungs. No focal airspace consolidation. No pleural effusion or pneumothorax. Advanced degenerative changes of the shoulders. IMPRESSION: Chronically prominent interstitial markings throughout both lungs. No focal airspace consolidation. Electronically Signed   By: Duanne GuessNicholas  Plundo D.O.   On: 10/24/2020 17:39    Procedures .Critical Care Performed by: Linwood DibblesKnapp, Hernando Reali, MD Authorized by: Linwood DibblesKnapp, Aj Crunkleton, MD   Critical care provider statement:    Critical care time (minutes):  45   Critical care was time spent personally by me on the following activities:  Discussions with consultants, evaluation of patient's response to treatment, examination of patient, ordering and performing treatments and interventions, ordering and review of laboratory studies, ordering and review of radiographic studies, pulse oximetry, re-evaluation of patient's condition,  obtaining history from patient or surrogate and review of old charts   (including critical care time)  Medications Ordered in ED Medications  cefTRIAXone (ROCEPHIN) 1 g in sodium chloride 0.9 % 100 mL IVPB (1 g Intravenous New Bag/Given 10/24/20 1948)    ED Course  I have reviewed the triage vital signs and the nursing notes.  Pertinent labs & imaging results that were available during my care of the patient were reviewed by me and considered in my medical decision making (see chart for details).  Clinical Course as of Oct 24 2054  Sat Oct 24, 2020  1909 UDS positive for opiates   [JK]  1909 Urinalysis suggestive of UTI.   [JK]  1909 Leukocytosis with white count elevated at 39.  Previous results normal   [JK]  1911 Chest x-ray without acute findings   [JK]  1912 BUN and creatinine elevated compared to previous   [JK]  1912 With patient's AKI and UTI we will insert a Foley catheter to rule out bladder outlet obstruction and we can follow her urine output   [JK]  1935 CT scan does show findings of subacute/acute infarct of corona radiata.   [JK]  1943 Discussed with Dr Wilford CornerArora.  Will order MRI brain.  Plan on medical admission.  He will consult on patient evaluate after MRI   [JK]  2005 Discussed results with patient's daughter and granddaughter.  They were upset because the patient has expressed wishes that she did not want to be sent to the hospital.  Patient has had prior strokes and has been told that she was going to have additional strokes.  I discussed with the family about her white blood cell count elevation as well as her abnormal kidney function.  I explained to her that we could just treat her with IV fluids and antibiotics and not do any further treatment if desired.  They were very concerned about her staying in the hospital and all and are considering whether to have her just go back to the nursing facility.   [JK]    Clinical Course User  Index [JK] Linwood Dibbles, MD    MDM Rules/Calculators/A&P               NIH Stroke Scale: 14           Patient presented to the ED for evaluation of stroke like symptoms.  Patient does have history of prior stroke and is a resident of a nursing facility.  Patient's exam was notable for right-sided weakness and speech difficulty.  She remained hemodynamically stable.  Her ED work-up was notable for significant leukocytosis.  She also appears to have a urinary tract infection.  Patient also has evidence of acute kidney injury.  Her WBC is severely elevated.  No hypotension but lactic acid elevated.  Sx  Concerning for sepsis.  I discussed the findings and my concerns that she could potentially have a life-threatening infection.  The patient and her daughter had a conversation.  Apparently the patient did not want to come to the hospital in the first place.  They have had this discussion previously.  Patient does have history of prior stroke and is not interested in any further care.  Patient has told the daughter that she wants to see her mother and father who are deceased.  Patient and her daughter understand that this illness could be life-threatening.  I specifically explained my concerns about the kidney failure and possible bloodstream infection, sepsis.    Pt and family understand the severity of the illness.  I offered admission to the hospital with comfort measures only.  Pt and daughter do not want to be admitted.  Pt will be discharged back to the nursing facility as requested. Final Clinical Impression(s) / ED Diagnoses Final diagnoses:  Cerebrovascular accident (CVA), unspecified mechanism (HCC)  AKI (acute kidney injury) (HCC)  Acute cystitis without hematuria    Rx / DC Orders ED Discharge Orders         Ordered    cefixime (SUPRAX) 400 MG CAPS capsule  Daily        10/24/20 2050           Linwood Dibbles, MD 10/24/20 2055

## 2020-10-29 LAB — CULTURE, BLOOD (ROUTINE X 2): Culture: NO GROWTH

## 2020-11-04 DEATH — deceased

## 2021-09-26 IMAGING — DX DG CHEST 1V PORT
1 series · 1 of 1 positions shown · non-contrast
Comparison: 08/24/2020

CLINICAL DATA: Confusion

EXAM:
PORTABLE CHEST 1 VIEW

[chest ap]
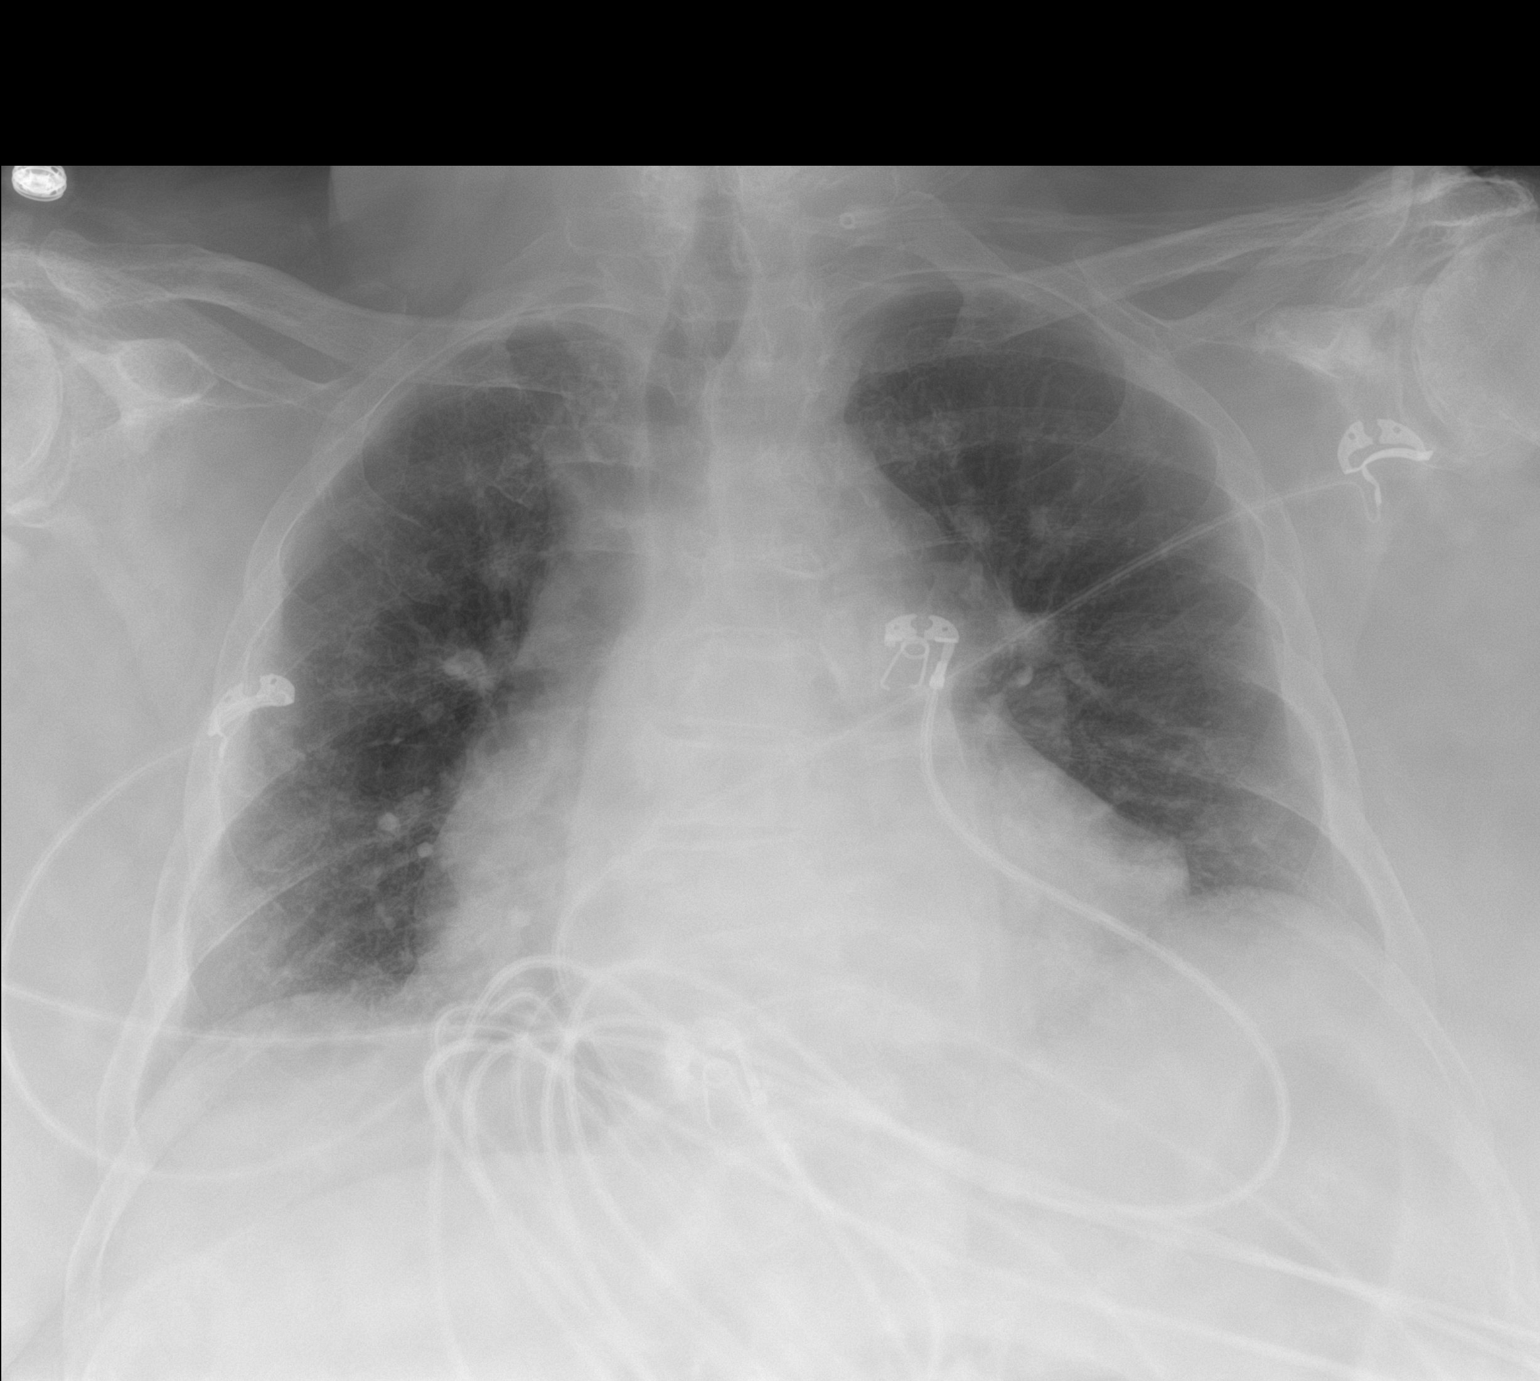

[1 of 1 positions shown; findings below may reference images not displayed]

FINDINGS: Stable cardiomegaly. Atherosclerotic calcification of the aortic
knob. Chronically prominent interstitial markings throughout both
lungs. No focal airspace consolidation. No pleural effusion or
pneumothorax. Advanced degenerative changes of the shoulders.
IMPRESSION: Chronically prominent interstitial markings throughout both lungs.
No focal airspace consolidation.
